# Patient Record
Sex: Male | Born: 2000 | Race: Black or African American | Hispanic: No | Marital: Single | Smoking: Never smoker
Health system: Southern US, Community
[De-identification: ages and names within clinical notes are randomized; demographics above are authoritative.]

## PROBLEM LIST (undated history)

## (undated) DIAGNOSIS — R51 Headache: Secondary | ICD-10-CM

## (undated) DIAGNOSIS — M6730 Transient synovitis, unspecified site: Secondary | ICD-10-CM

## (undated) DIAGNOSIS — Q5522 Retractile testis: Secondary | ICD-10-CM

## (undated) DIAGNOSIS — J45909 Unspecified asthma, uncomplicated: Secondary | ICD-10-CM

## (undated) DIAGNOSIS — E669 Obesity, unspecified: Secondary | ICD-10-CM

## (undated) DIAGNOSIS — Z91018 Allergy to other foods: Secondary | ICD-10-CM

## (undated) DIAGNOSIS — T7840XA Allergy, unspecified, initial encounter: Secondary | ICD-10-CM

## (undated) DIAGNOSIS — L309 Dermatitis, unspecified: Secondary | ICD-10-CM

## (undated) HISTORY — DX: Transient synovitis, unspecified site: M67.30

## (undated) HISTORY — DX: Retractile testis: Q55.22

## (undated) HISTORY — DX: Headache: R51

## (undated) HISTORY — DX: Allergy to other foods: Z91.018

## (undated) HISTORY — DX: Dermatitis, unspecified: L30.9

## (undated) HISTORY — DX: Allergy, unspecified, initial encounter: T78.40XA

## (undated) HISTORY — DX: Obesity, unspecified: E66.9

## (undated) HISTORY — PX: TONSILLECTOMY AND ADENOIDECTOMY: SUR1326

---
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
## 2022-04-01 DIAGNOSIS — H5213 Myopia, bilateral: Secondary | ICD-10-CM | POA: Diagnosis not present
